# Patient Record
Sex: Female | Born: 1990 | Race: White | Hispanic: No | Marital: Single | Smoking: Never smoker
Health system: Southern US, Community
[De-identification: ages and names within clinical notes are randomized; demographics above are authoritative.]

---
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
## 2009-01-17 ENCOUNTER — Ambulatory Visit: Payer: Self-pay | Admitting: Dermatology

## 2009-03-27 ENCOUNTER — Emergency Department: Payer: Self-pay | Admitting: Emergency Medicine

## 2019-04-19 ENCOUNTER — Other Ambulatory Visit: Payer: Self-pay

## 2019-04-19 DIAGNOSIS — Z20822 Contact with and (suspected) exposure to covid-19: Secondary | ICD-10-CM

## 2019-04-22 LAB — NOVEL CORONAVIRUS, NAA: SARS-CoV-2, NAA: NOT DETECTED

## 2019-08-01 ENCOUNTER — Other Ambulatory Visit: Payer: Self-pay

## 2019-08-01 DIAGNOSIS — Z20822 Contact with and (suspected) exposure to covid-19: Secondary | ICD-10-CM

## 2019-08-02 LAB — NOVEL CORONAVIRUS, NAA: SARS-CoV-2, NAA: DETECTED — AB

## 2020-04-07 ENCOUNTER — Emergency Department (HOSPITAL_COMMUNITY)
Admission: EM | Admit: 2020-04-07 | Discharge: 2020-04-08 | Disposition: A | Discharge status: Home / Self Care | Payer: Self-pay | Attending: Emergency Medicine | Admitting: Emergency Medicine

## 2020-04-07 ENCOUNTER — Encounter (HOSPITAL_COMMUNITY): Payer: Self-pay | Admitting: Emergency Medicine

## 2020-04-07 ENCOUNTER — Other Ambulatory Visit: Payer: Self-pay

## 2020-04-07 DIAGNOSIS — R1011 Right upper quadrant pain: Secondary | ICD-10-CM | POA: Insufficient documentation

## 2020-04-07 DIAGNOSIS — K2970 Gastritis, unspecified, without bleeding: Secondary | ICD-10-CM | POA: Insufficient documentation

## 2020-04-07 LAB — COMPREHENSIVE METABOLIC PANEL
ALT: 15 U/L (ref 0–44)
AST: 19 U/L (ref 15–41)
Albumin: 3.7 g/dL (ref 3.5–5.0)
Alkaline Phosphatase: 50 U/L (ref 38–126)
Anion gap: 9 (ref 5–15)
BUN: 9 mg/dL (ref 6–20)
CO2: 23 mmol/L (ref 22–32)
Calcium: 8.4 mg/dL — ABNORMAL LOW (ref 8.9–10.3)
Chloride: 105 mmol/L (ref 98–111)
Creatinine, Ser: 0.77 mg/dL (ref 0.44–1.00)
GFR calc Af Amer: >60 mL/min (ref >60)
GFR calc non Af Amer: >60 mL/min (ref >60)
Glucose, Bld: 107 mg/dL — ABNORMAL HIGH (ref 70–99)
Potassium: 3.4 mmol/L — ABNORMAL LOW (ref 3.5–5.1)
Sodium: 137 mmol/L (ref 135–145)
Total Bilirubin: 0.4 mg/dL (ref 0.3–1.2)
Total Protein: 5.7 g/dL — ABNORMAL LOW (ref 6.5–8.1)

## 2020-04-07 LAB — URINALYSIS, ROUTINE W REFLEX MICROSCOPIC
Bacteria, UA: NONE SEEN
Bilirubin Urine: NEGATIVE
Glucose, UA: NEGATIVE mg/dL
Ketones, ur: NEGATIVE mg/dL
Leukocytes,Ua: NEGATIVE
Nitrite: NEGATIVE
Protein, ur: NEGATIVE mg/dL
Specific Gravity, Urine: 1.013 (ref 1.005–1.030)
pH: 6 (ref 5.0–8.0)

## 2020-04-07 LAB — CBC
HCT: 38.9 % (ref 36.0–46.0)
Hemoglobin: 12.9 g/dL (ref 12.0–15.0)
MCH: 30.1 pg (ref 26.0–34.0)
MCHC: 33.2 g/dL (ref 30.0–36.0)
MCV: 90.7 fL (ref 80.0–100.0)
Platelets: 219 10*3/uL (ref 150–400)
RBC: 4.29 MIL/uL (ref 3.87–5.11)
RDW: 12.8 % (ref 11.5–15.5)
WBC: 7 10*3/uL (ref 4.0–10.5)
nRBC: 0 % (ref 0.0–0.2)

## 2020-04-07 LAB — I-STAT BETA HCG BLOOD, ED (MC, WL, AP ONLY): I-stat hCG, quantitative: <5 m[IU]/mL (ref <5)

## 2020-04-07 LAB — LIPASE, BLOOD: Lipase: 25 U/L (ref 11–51)

## 2020-04-07 MED ORDER — SODIUM CHLORIDE 0.9% FLUSH
3.0000 mL | Freq: Once | INTRAVENOUS | Status: AC
  Administered 2020-04-08: 3 mL via INTRAVENOUS

## 2020-04-07 NOTE — ED Triage Notes (Signed)
Patient reports mid abdominal and low back pain onset this week with constipation , no emesis or diarrhea , denies fever or chills .

## 2020-04-08 ENCOUNTER — Emergency Department (HOSPITAL_COMMUNITY): Payer: Self-pay

## 2020-04-08 MED ORDER — SODIUM CHLORIDE 0.9 % IV BOLUS
1000.0000 mL | Freq: Once | INTRAVENOUS | Status: AC
  Administered 2020-04-08: 1000 mL via INTRAVENOUS

## 2020-04-08 MED ORDER — FAMOTIDINE IN NACL 20-0.9 MG/50ML-% IV SOLN
20.0000 mg | Freq: Once | INTRAVENOUS | Status: AC
  Administered 2020-04-08: 20 mg via INTRAVENOUS
  Filled 2020-04-08: qty 50

## 2020-04-08 MED ORDER — IOHEXOL 300 MG/ML  SOLN
100.0000 mL | Freq: Once | INTRAMUSCULAR | Status: AC | PRN
  Administered 2020-04-08: 100 mL via INTRAVENOUS

## 2020-04-08 MED ORDER — FAMOTIDINE 20 MG PO TABS
20.0000 mg | ORAL_TABLET | Freq: Two times a day (BID) | ORAL | 0 refills | Status: AC
Start: 2020-04-08 — End: ?

## 2020-04-08 NOTE — ED Provider Notes (Signed)
[facility] EMERGENCY DEPARTMENT Provider Note   CSN: [id number] Arrival date & time: 04/07/20  2138     History Chief Complaint  Patient presents with  . Abdominal Pain    Alicia Holt is a 29 y.o. female.  30 year old female with prior medical history detailed below presents for evaluation of abdominal discomfort.  Patient reports epigastric abdominal discomfort over the last 2 days.  Symptoms appear to be worse with eating.  She denies associated nausea, vomiting, fever, bowel movement change, urinary symptoms, vaginal discharge, or other specific associated complaint.  She did not take anything at home for her symptoms.  The history is provided by the patient.  Abdominal Pain Pain location:  Epigastric Pain quality: aching, cramping and gnawing   Pain radiates to:  Does not radiate Pain severity:  Moderate Onset quality:  Gradual Duration:  2 days Timing:  Constant Progression:  Waxing and waning Chronicity:  New Relieved by:  Nothing Worsened by:  Nothing Ineffective treatments:  None tried      No past medical history on file.  There are no problems to display for this patient.   History reviewed. No pertinent surgical history.   OB History   No obstetric history on file.     No family history on file.  Social History   Tobacco Use  . Smoking status: Never Smoker  . Smokeless tobacco: Never Used  Substance Use Topics  . Alcohol use: Yes  . Drug use: Never    Home Medications Prior to Admission medications   Medication Sig Start Date End Date Taking? Authorizing Provider  ibuprofen (ADVIL) 200 MG tablet Take 200 mg by mouth every 6 (six) hours as needed for fever or mild pain.   Yes [provider]    Allergies    Claritin [loratadine]  Review of Systems   Review of Systems  Gastrointestinal: Positive for abdominal pain.  All other systems reviewed and are negative.   Physical Exam Updated Vital Signs BP  127/71 (BP Location: Right Arm)   Pulse (!) 115   Temp 98.6 F (37 C) (Oral)   Resp 16   Ht 5\' 6"  (1.676 m)   Wt 90 kg   SpO2 92%   BMI 32.02 kg/m   Physical Exam Vitals and nursing note reviewed.  Constitutional:      General: She is not in acute distress.    Appearance: She is well-developed.  HENT:     Head: Normocephalic and atraumatic.  Eyes:     Conjunctiva/sclera: Conjunctivae normal.     Pupils: Pupils are equal, round, and reactive to light.  Cardiovascular:     Rate and Rhythm: Normal rate and regular rhythm.     Heart sounds: Normal heart sounds.  Pulmonary:     Effort: Pulmonary effort is normal. No respiratory distress.     Breath sounds: Normal breath sounds.  Abdominal:     General: There is no distension.     Palpations: Abdomen is soft.     Tenderness: There is no abdominal tenderness.  Musculoskeletal:        General: No deformity. Normal range of motion.     Cervical back: Normal range of motion and neck supple.  Skin:    General: Skin is warm and dry.  Neurological:     Mental Status: She is alert and oriented to person, place, and time.     ED Results / Procedures / Treatments   Labs (all labs ordered  are listed, but only abnormal results are displayed) Labs Reviewed  COMPREHENSIVE METABOLIC PANEL - Abnormal; Notable for the following components:      Result Value   Potassium 3.4 (*)    Glucose, Bld 107 (*)    Calcium 8.4 (*)    Total Protein 5.7 (*)    All other components within normal limits  URINALYSIS, ROUTINE W REFLEX MICROSCOPIC - Abnormal; Notable for the following components:   Hgb urine dipstick SMALL (*)    All other components within normal limits  LIPASE, BLOOD  CBC  I-STAT BETA HCG BLOOD, ED ([facility], [facility], [facility] ONLY)    EKG None  Radiology CT Abdomen Pelvis W Contrast  Result Date: 04/08/2020 CLINICAL DATA:  Mid abdominal pain, low back pain EXAM: CT ABDOMEN AND PELVIS WITH CONTRAST TECHNIQUE: Multidetector CT imaging of the  abdomen and pelvis was performed using the standard protocol following bolus administration of intravenous contrast. CONTRAST:  OMNIPAQUE IOHEXOL 300 MG/ML  SOLN COMPARISON:  None. FINDINGS: Lower chest: Lung bases are clear. No effusions. Heart is normal size. Hepatobiliary: Question gallstone within the gallbladder. No focal hepatic abnormality or biliary ductal dilatation. Pancreas: No focal abnormality or ductal dilatation. Spleen: No focal abnormality.  Normal size. Adrenals/Urinary Tract: No adrenal abnormality. No focal renal abnormality. No stones or hydronephrosis. Urinary bladder is unremarkable. Stomach/Bowel: Scattered colonic diverticulosis. No active diverticulitis. Appendix is normal. Stomach and small bowel decompressed, unremarkable. Vascular/Lymphatic: No evidence of aneurysm or adenopathy. Reproductive: IUD noted in the uterus.  No adnexal masses. Other: No free fluid or free air. Musculoskeletal: No acute bony abnormality. IMPRESSION: Possible gallstone within the gallbladder. No evidence of cholecystitis. Normal appendix. No acute findings. Electronically Signed   By: Charlett Nose M.D.   On: 04/08/2020 10:56   US Abdomen Limited RUQ  Result Date: 04/08/2020 CLINICAL DATA:  Right upper quadrant pain EXAM: ULTRASOUND ABDOMEN LIMITED RIGHT UPPER QUADRANT COMPARISON:  None. FINDINGS: Gallbladder: No gallstones or wall thickening visualized. No sonographic Murphy sign noted by sonographer. The previously questioned stone by CT likely reflected a fold in the gallbladder. Common bile duct: Diameter: Normal caliber, 3 mm Liver: No focal lesion identified. Within normal limits in parenchymal echogenicity. Portal vein is patent on color Doppler imaging with normal direction of blood flow towards the liver. Other: None. IMPRESSION: Normal study. Electronically Signed   By: Charlett Nose M.D.   On: 04/08/2020 11:56    Procedures Procedures (including critical care time)  Medications Ordered  in ED Medications  sodium chloride flush (NS) 0.9 % injection 3 mL (3 mLs Intravenous Given 04/08/20 0842)  sodium chloride 0.9 % bolus 1,000 mL (1,000 mLs Intravenous New Bag/Given 04/08/20 0843)  famotidine (PEPCID) IVPB 20 mg premix (0 mg Intravenous Stopped 04/08/20 0937)  iohexol (OMNIPAQUE) 300 MG/ML solution 100 mL (100 mLs Intravenous Contrast Given 04/08/20 1045)    ED Course  I have reviewed the triage vital signs and the nursing notes.  Pertinent labs & imaging results that were available during my care of the patient were reviewed by me and considered in my medical decision making (see chart for details).    MDM Rules/Calculators/A&P                          MDM  Screen complete  Alicia Holt was evaluated in Emergency Department on 04/08/2020 for the symptoms described in the history of present illness. She was evaluated in the context of the global  COVID-19 pandemic, which necessitated consideration that the patient might be at risk for infection with the SARS-CoV-2 virus that causes COVID-19. Institutional protocols and algorithms that pertain to the evaluation of patients at risk for COVID-19 are in a state of rapid change based on information released by regulatory bodies including the CDC and federal and state organizations. These policies and algorithms were followed during the patient's care in the ED.  Patient is presenting for evaluation of epigastric abdominal discomfort.  Patient's described symptoms are perhaps most consistent with possible gastritis.  Work-up did not demonstrate evidence of significant acute abnormality.  Patient does feel improved following her ED evaluation.  Patient is advised to use Pepcid as prescribed for treatment of possible gastritis.  Follow-up strongly encouraged.  Strict return precautions given and understood.  Final Clinical Impression(s) / ED Diagnoses Final diagnoses:  RUQ abdominal pain  Gastritis without bleeding,  unspecified chronicity, unspecified gastritis type    Rx / DC Orders ED Discharge Orders         Ordered    famotidine (PEPCID) 20 MG tablet  2 times daily     Discontinue  Reprint     04/08/20 1218           Wynetta Fines, MD 04/08/20 1221

## 2020-04-08 NOTE — ED Notes (Signed)
Pt discharge instructions and prescriptions reviewed with the patient. The patient verbalized understanding of instructions. Pt discharged. 

## 2020-04-08 NOTE — ED Notes (Signed)
Patient is resting comfortably. Back from Scan.

## 2020-04-08 NOTE — Discharge Instructions (Signed)
Return for any problem.  Follow-up with your regular care provider as instructed.  Use Pepcid as prescribed for treatment of possible gastritis.

## 2020-09-30 ENCOUNTER — Other Ambulatory Visit: Payer: Self-pay

## 2020-09-30 DIAGNOSIS — Z20822 Contact with and (suspected) exposure to covid-19: Secondary | ICD-10-CM

## 2020-10-02 LAB — NOVEL CORONAVIRUS, NAA: SARS-CoV-2, NAA: NOT DETECTED

## 2020-10-02 LAB — SARS-COV-2, NAA 2 DAY TAT

## 2021-10-18 IMAGING — CT CT ABD-PELV W/ CM
2 of 4 series · 17 of 46 positions shown, 19 images · IV contrast (Omni 300)
Comparison: None.

CLINICAL DATA: Mid abdominal pain, low back pain

EXAM:
CT ABDOMEN AND PELVIS WITH CONTRAST
TECHNIQUE: Multidetector CT imaging of the abdomen and pelvis was performed
using the standard protocol following bolus administration of
intravenous contrast.
CONTRAST:  100mL OMNIPAQUE IOHEXOL 300 MG/ML  SOLN

[Series 4: a/p w/ cor · coronal · 0.85mm/px · 3 of 151 slices shown]
[im 51/151  soft-tissue]
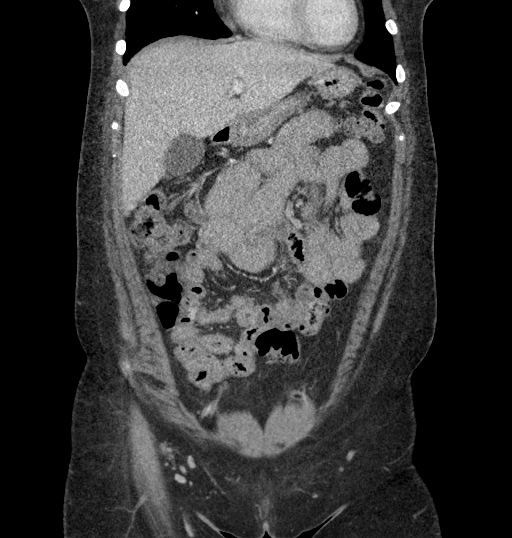
[im 67/151  soft-tissue]
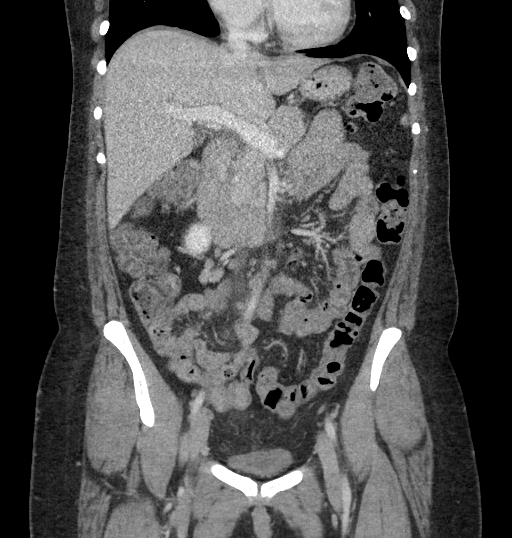
[im 84/151  soft-tissue]
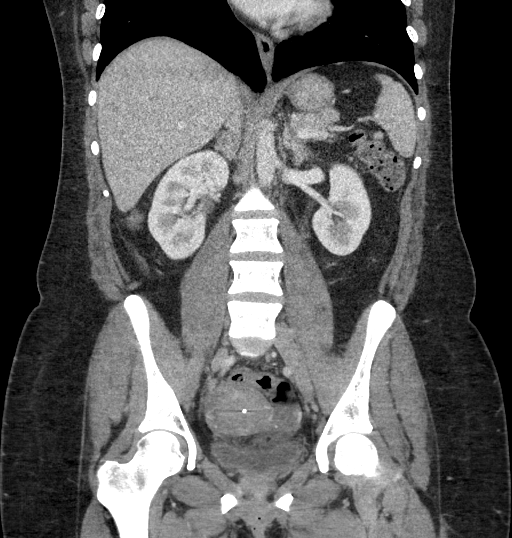

[Series 7: a/p w/ 5mm · axial · 0.87mm/px · z∈[-66,+354]mm · 14 of 92 slices shown, 16 images]
[im 4/92  soft-tissue]
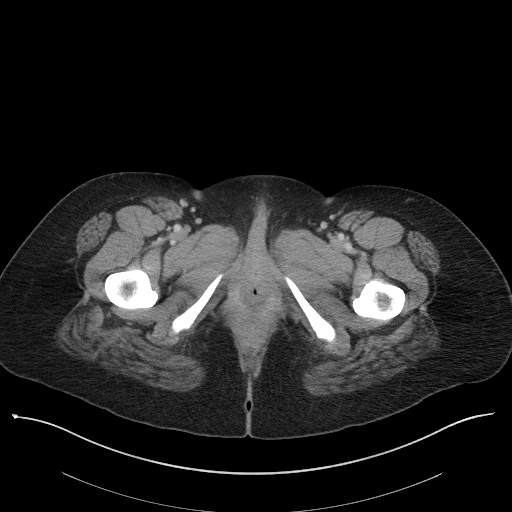
[im 4/92  bone]
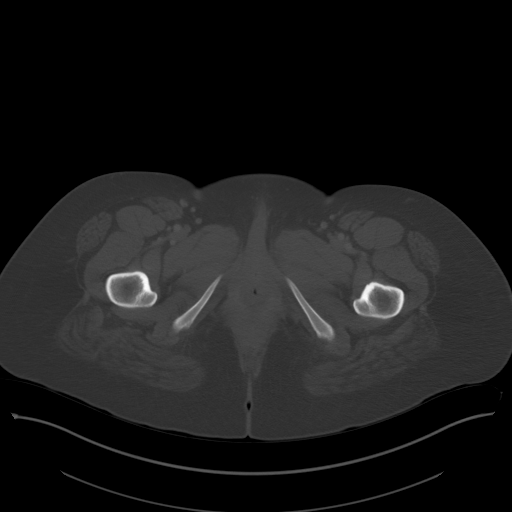
[im 12/92  soft-tissue]
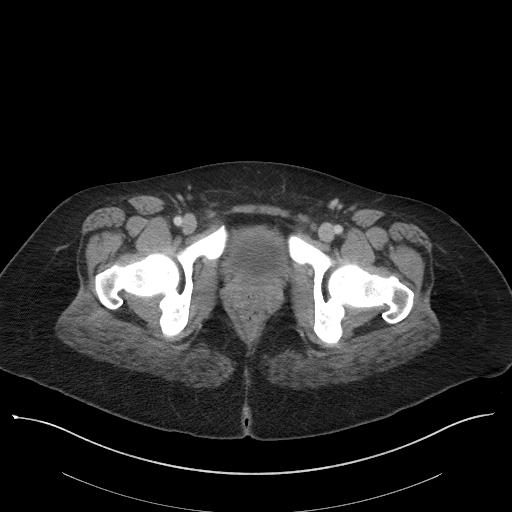
[im 19/92  soft-tissue]
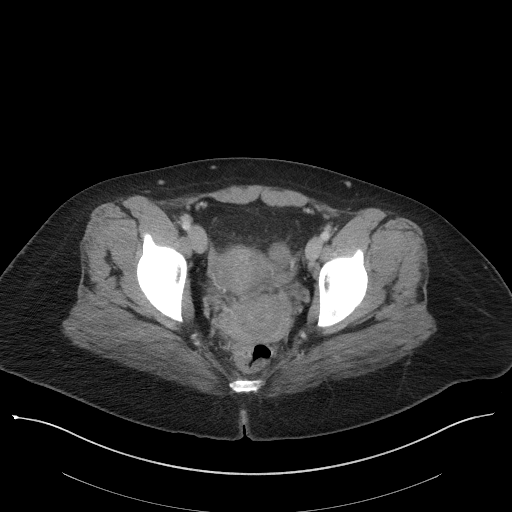
[im 23/92  soft-tissue]
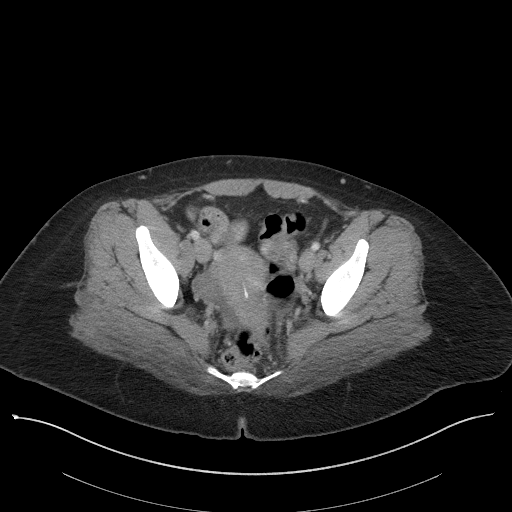
[im 31/92  soft-tissue]
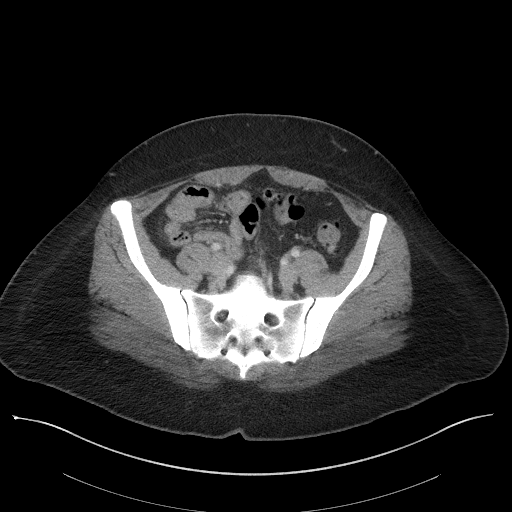
[im 38/92  soft-tissue]
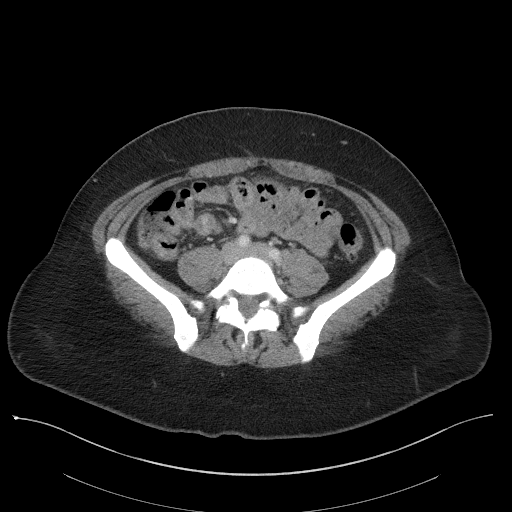
[im 42/92  soft-tissue]
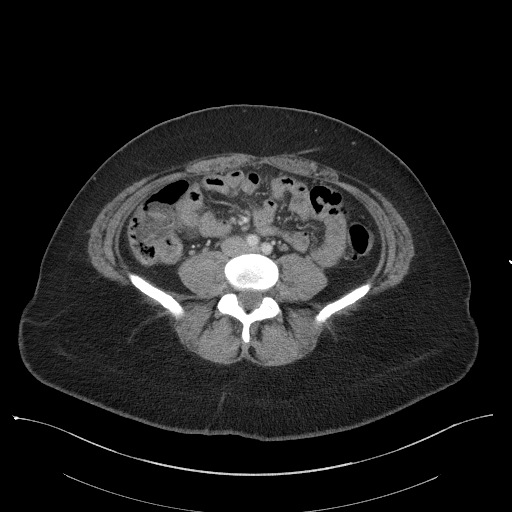
[im 50/92  soft-tissue]
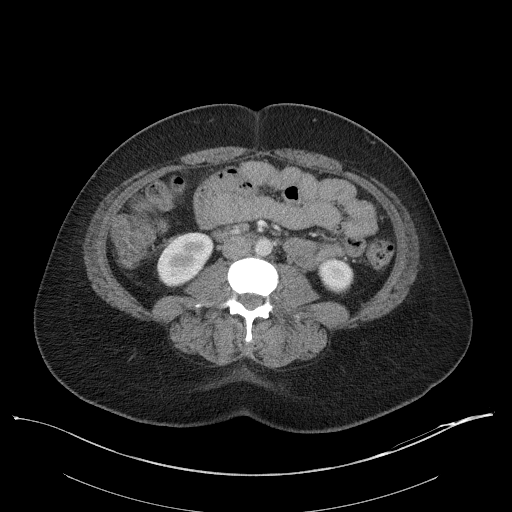
[im 54/92  soft-tissue]
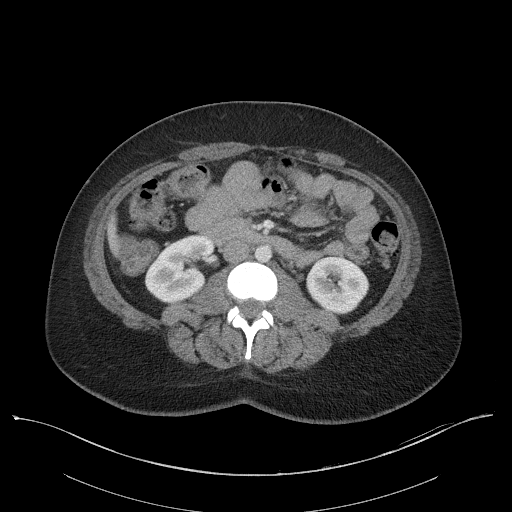
[im 54/92  bone]
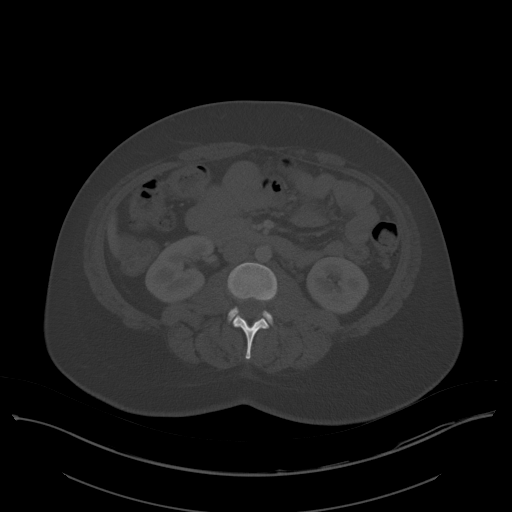
[im 61/92  soft-tissue]
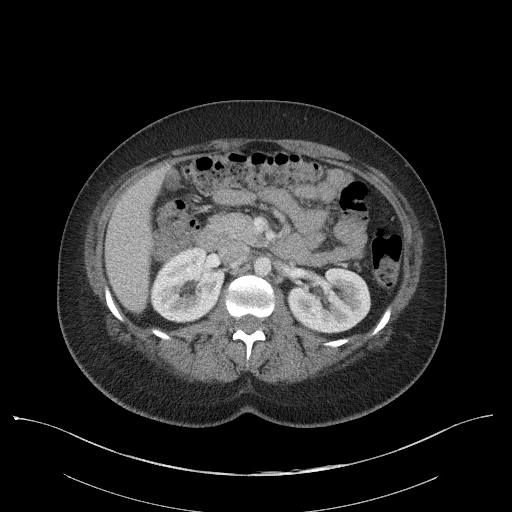
[im 69/92  soft-tissue]
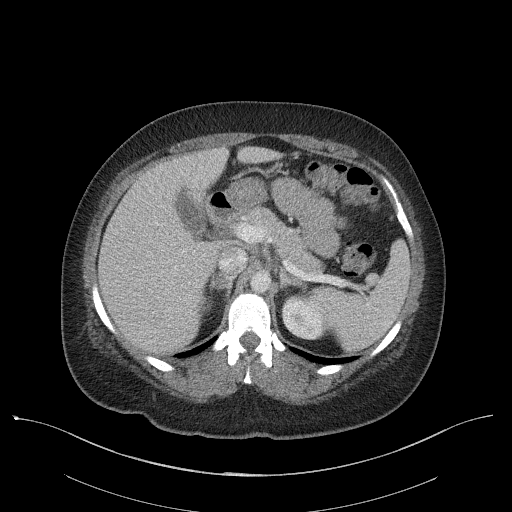
[im 73/92  soft-tissue]
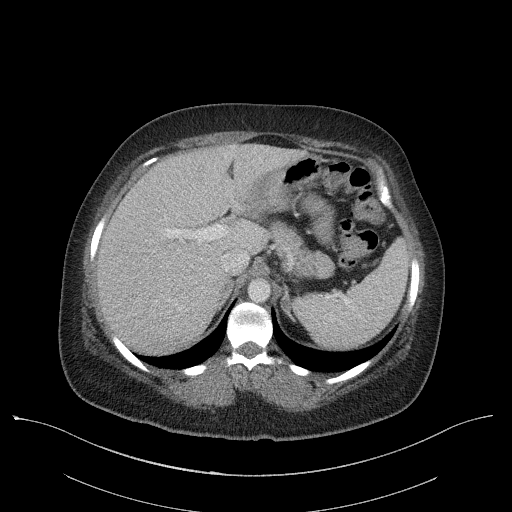
[im 80/92  soft-tissue]
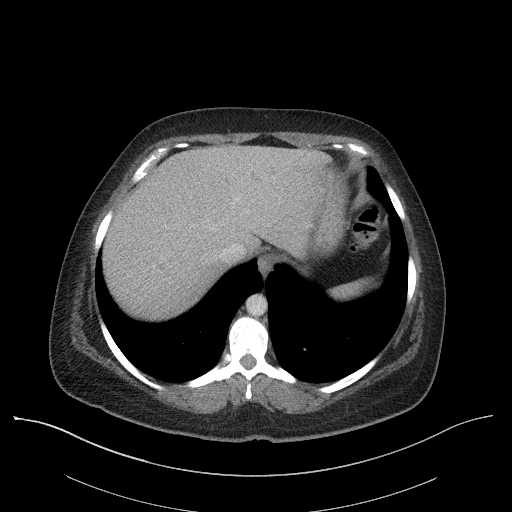
[im 88/92  soft-tissue]
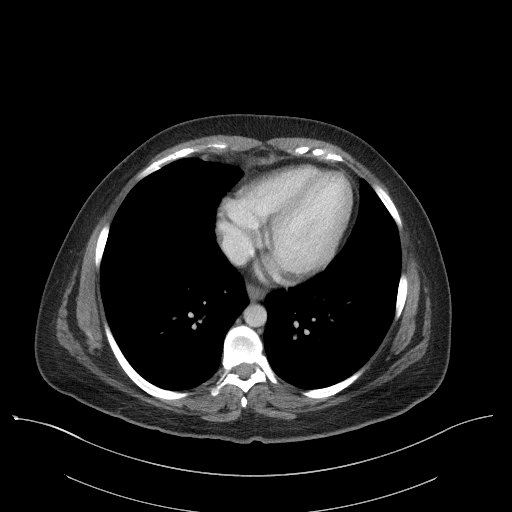

[17 of 46 positions shown; findings below may reference images not displayed]

FINDINGS: Lower chest: Lung bases are clear. No effusions. Heart is normal
size.

Hepatobiliary: Question gallstone within the gallbladder. No focal
hepatic abnormality or biliary ductal dilatation.

Pancreas: No focal abnormality or ductal dilatation.

Spleen: No focal abnormality.  Normal size.

Adrenals/Urinary Tract: No adrenal abnormality. No focal renal
abnormality. No stones or hydronephrosis. Urinary bladder is
unremarkable.

Stomach/Bowel: Scattered colonic diverticulosis. No active
diverticulitis. Appendix is normal. Stomach and small bowel
decompressed, unremarkable.

Vascular/Lymphatic: No evidence of aneurysm or adenopathy.

Reproductive: IUD noted in the uterus.  No adnexal masses.

Other: No free fluid or free air.

Musculoskeletal: No acute bony abnormality.
IMPRESSION: Possible gallstone within the gallbladder. No evidence of
cholecystitis.

Normal appendix.

No acute findings.
